# Patient Record
Sex: Female | Born: 1990 | Hispanic: No | Marital: Single | Smoking: Never smoker
Health system: Southern US, Community
[De-identification: ages and names within clinical notes are randomized; demographics above are authoritative.]

---

## 2020-07-25 ENCOUNTER — Ambulatory Visit: Payer: Self-pay | Admitting: Family Medicine

## 2020-11-30 ENCOUNTER — Encounter (HOSPITAL_BASED_OUTPATIENT_CLINIC_OR_DEPARTMENT_OTHER): Payer: Self-pay

## 2020-11-30 ENCOUNTER — Emergency Department (HOSPITAL_BASED_OUTPATIENT_CLINIC_OR_DEPARTMENT_OTHER)
Admission: EM | Admit: 2020-11-30 | Discharge: 2020-11-30 | Disposition: A | Discharge status: Home / Self Care | Payer: Self-pay | Attending: Emergency Medicine | Admitting: Emergency Medicine

## 2020-11-30 ENCOUNTER — Other Ambulatory Visit: Payer: Self-pay

## 2020-11-30 ENCOUNTER — Emergency Department (HOSPITAL_BASED_OUTPATIENT_CLINIC_OR_DEPARTMENT_OTHER): Payer: Self-pay

## 2020-11-30 DIAGNOSIS — S61011A Laceration without foreign body of right thumb without damage to nail, initial encounter: Secondary | ICD-10-CM | POA: Insufficient documentation

## 2020-11-30 DIAGNOSIS — S80812A Abrasion, left lower leg, initial encounter: Secondary | ICD-10-CM | POA: Insufficient documentation

## 2020-11-30 DIAGNOSIS — S51842A Puncture wound with foreign body of left forearm, initial encounter: Secondary | ICD-10-CM | POA: Insufficient documentation

## 2020-11-30 DIAGNOSIS — S61532A Puncture wound without foreign body of left wrist, initial encounter: Secondary | ICD-10-CM | POA: Insufficient documentation

## 2020-11-30 DIAGNOSIS — W540XXA Bitten by dog, initial encounter: Secondary | ICD-10-CM | POA: Insufficient documentation

## 2020-11-30 DIAGNOSIS — Z23 Encounter for immunization: Secondary | ICD-10-CM | POA: Insufficient documentation

## 2020-11-30 DIAGNOSIS — Z2914 Encounter for prophylactic rabies immune globin: Secondary | ICD-10-CM | POA: Insufficient documentation

## 2020-11-30 MED ORDER — RABIES VACCINE, PCEC IM SUSR
1.0000 mL | Freq: Once | INTRAMUSCULAR | Status: AC
  Administered 2020-11-30: 1 mL via INTRAMUSCULAR
  Filled 2020-11-30: qty 1

## 2020-11-30 MED ORDER — AMOXICILLIN-POT CLAVULANATE 875-125 MG PO TABS: 1.0000 | ORAL_TABLET | Freq: Two times a day (BID) | ORAL | 0 refills | Status: AC

## 2020-11-30 MED ORDER — IBUPROFEN 400 MG PO TABS
600.0000 mg | ORAL_TABLET | Freq: Once | ORAL | Status: AC
  Administered 2020-11-30: 600 mg via ORAL
  Filled 2020-11-30: qty 1

## 2020-11-30 MED ORDER — TETANUS-DIPHTH-ACELL PERTUSSIS 5-2.5-18.5 LF-MCG/0.5 IM SUSY
0.5000 mL | PREFILLED_SYRINGE | Freq: Once | INTRAMUSCULAR | Status: AC
  Administered 2020-11-30: 0.5 mL via INTRAMUSCULAR
  Filled 2020-11-30: qty 0.5

## 2020-11-30 MED ORDER — AMOXICILLIN-POT CLAVULANATE 875-125 MG PO TABS
1.0000 | ORAL_TABLET | Freq: Once | ORAL | Status: AC
  Administered 2020-11-30: 1 via ORAL
  Filled 2020-11-30: qty 1

## 2020-11-30 MED ORDER — RABIES IMMUNE GLOBULIN 150 UNIT/ML IM INJ
20.0000 [IU]/kg | INJECTION | Freq: Once | INTRAMUSCULAR | Status: AC
  Administered 2020-11-30: 1200 [IU] via INTRAMUSCULAR
  Filled 2020-11-30: qty 8

## 2020-11-30 NOTE — Discharge Instructions (Addendum)
Take the antibiotics as prescribed to help prevent infection.  Keep the wounds clean and dry.  You need to return on January 23 for rabies booster, you need to return on January 27 for rabies booster again, and you need to return on February 3 for your final rabies booster.  These will be done at [facility].  We are mandated to report dog bites from unvaccinated dog to animal control.  They will follow-up with you.

## 2020-11-30 NOTE — ED Triage Notes (Signed)
Pt states one of her dogs bit her L wrist and R thumb last night around 0230. Pt states the dog does not have their vaccines.
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
## 2021-10-08 ENCOUNTER — Encounter (HOSPITAL_COMMUNITY): Payer: Self-pay

## 2021-10-08 ENCOUNTER — Emergency Department (HOSPITAL_COMMUNITY)
Admission: EM | Admit: 2021-10-08 | Discharge: 2021-10-08 | Disposition: A | Discharge status: Home / Self Care | Payer: Self-pay | Attending: Emergency Medicine | Admitting: Emergency Medicine

## 2021-10-08 ENCOUNTER — Other Ambulatory Visit: Payer: Self-pay

## 2021-10-08 DIAGNOSIS — S51852A Open bite of left forearm, initial encounter: Secondary | ICD-10-CM | POA: Insufficient documentation

## 2021-10-08 DIAGNOSIS — W540XXA Bitten by dog, initial encounter: Secondary | ICD-10-CM | POA: Insufficient documentation

## 2021-10-08 DIAGNOSIS — Z5321 Procedure and treatment not carried out due to patient leaving prior to being seen by health care provider: Secondary | ICD-10-CM | POA: Insufficient documentation

## 2021-10-08 NOTE — ED Notes (Addendum)
Attempted to irrigate patient wounds. Patient refuses. States she has cleaned wounds already. Wounds visibly dressed with gauze and coband. PA aware.

## 2021-10-08 NOTE — ED Triage Notes (Signed)
Patient reports that she was bitten by her dog (red nose pitbull). Patient got in between 2 dogs while fighting. Patient has several bit marks to the left forearm.  Patient states the dog was not up to date on her vaccinations.

## 2021-10-08 NOTE — ED Provider Notes (Signed)
Emergency Medicine Provider Triage Evaluation Note  Sandra Hensley , a 30 y.o. female  was evaluated in triage.  Pt complains of dog bite that occurred 1 hour ago by about a 57-year-old red.  She is unsure if the dog is up-to-date on its shots.  She states she got a tetanus shot about a year ago when she was last bitten by the dog.  She states her dog was fighting another dog and she tried to stop it when she was bitten on her left arm.   Review of Systems  Positive: Dog bite to left arm Negative:   Physical Exam  There were no vitals taken for this visit. Gen:   Awake, no distress   Resp:  Normal effort  MSK:   Moves extremities without difficulty  Other:  Dog bite to left upper extremity.  Bleeding controlled with pressure  Medical Decision Making  Medically screening exam initiated at 4:23 PM.  Appropriate orders placed.  Sandra Hensley was informed that the remainder of the evaluation will be completed by another provider, this initial triage assessment does not replace that evaluation, and the importance of remaining in the ED until their evaluation is complete.     Sandra [place], PA-C 10/08/21 1625    Sandra Grizzle, MD 10/10/21 1024

## 2021-10-08 NOTE — ED Notes (Signed)
PT ambulatory out of department stating her ride is here. PA made aware.

## 2021-10-08 NOTE — ED Provider Notes (Signed)
Notified that patient left before I had an opportunity to see her.  Nurse reports that patient has self dressed her wounds after she had an MSE exam done and refused to let the nurse undress stating she had cleaned the wounds herself and dressed them.  Discussion was had with patient during MSE exam she would require irrigation of the wounds to prevent developing of infection and that she would require antibiotics.   Marita Kansas, PA-C 10/08/21 1817    Terald Sleeper, MD 10/08/21 [phone]

## 2022-01-23 IMAGING — DX DG WRIST COMPLETE 3+V*L*
4 series · 4 of 4 positions shown · non-contrast
Comparison: No prior.

CLINICAL DATA: Dog bite.  Left wrist pain.

EXAM:
LEFT WRIST - COMPLETE 3+ VIEW

[wrist pa]
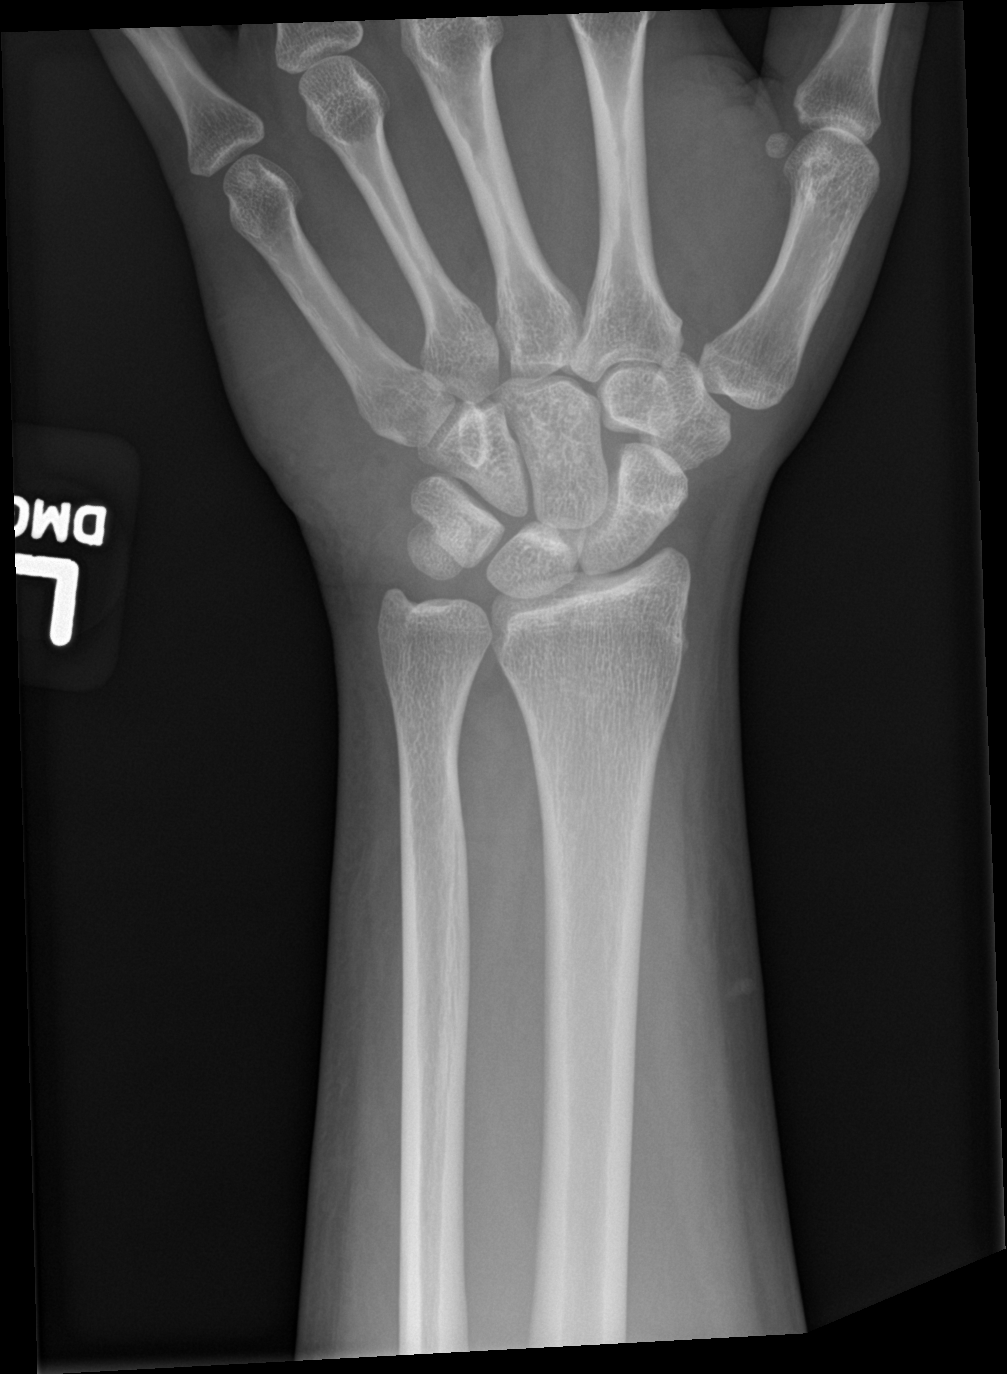

[wrist obl]
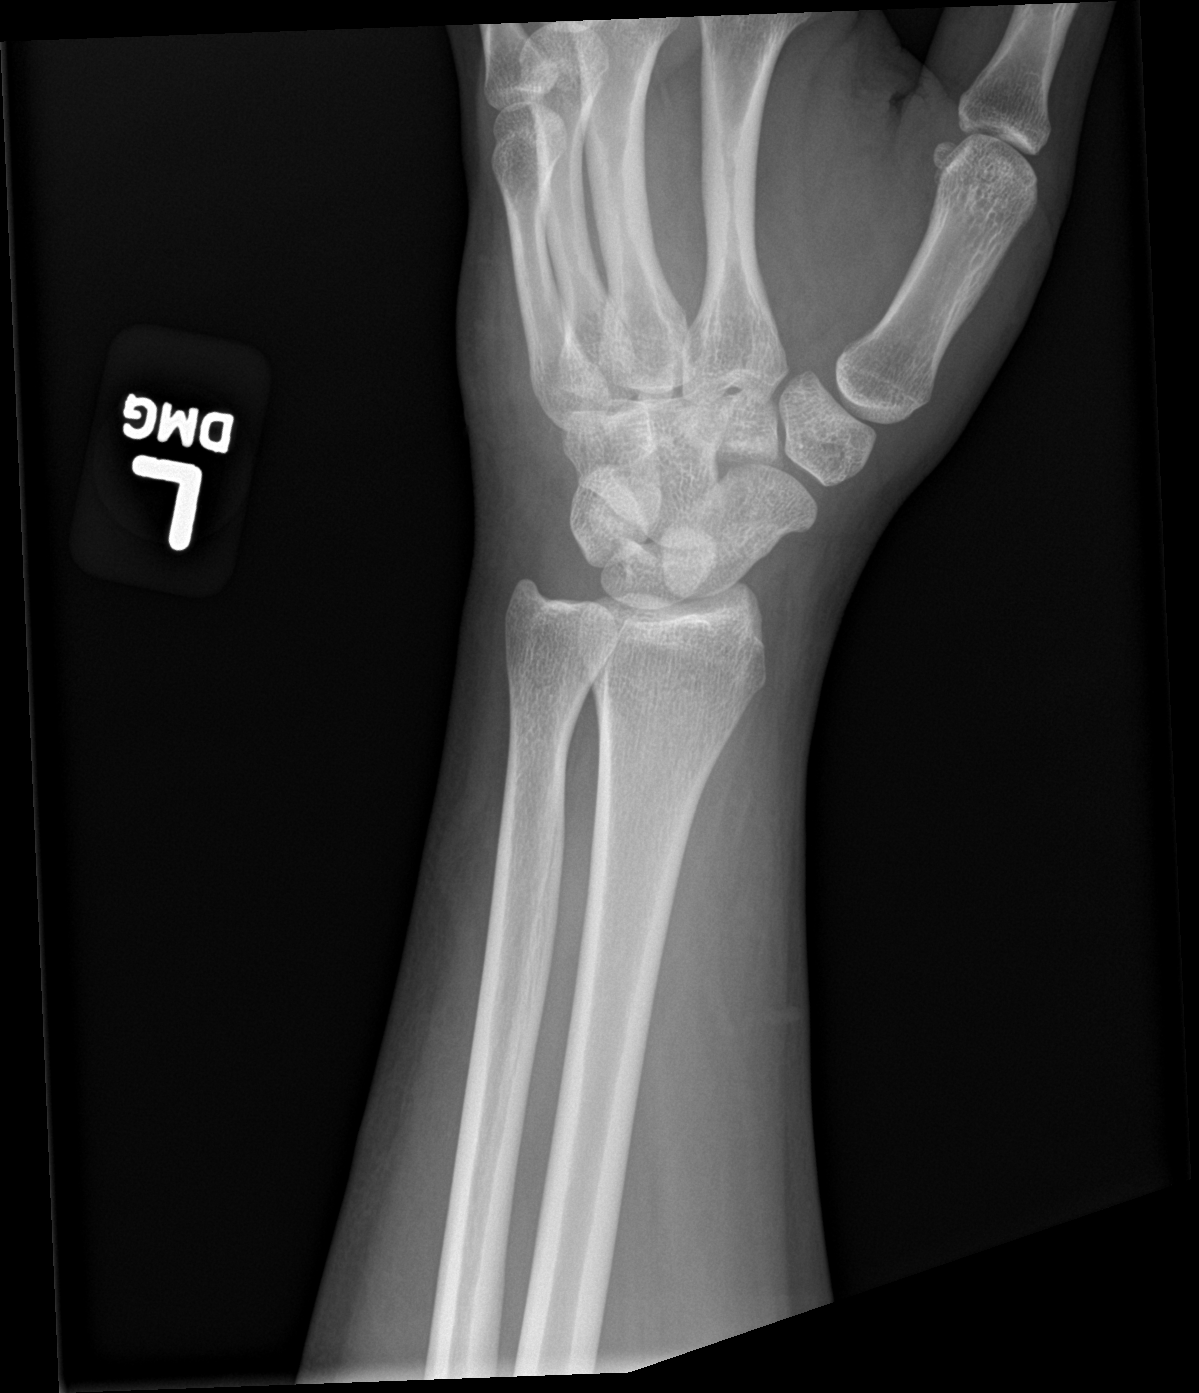

[wrist lat]
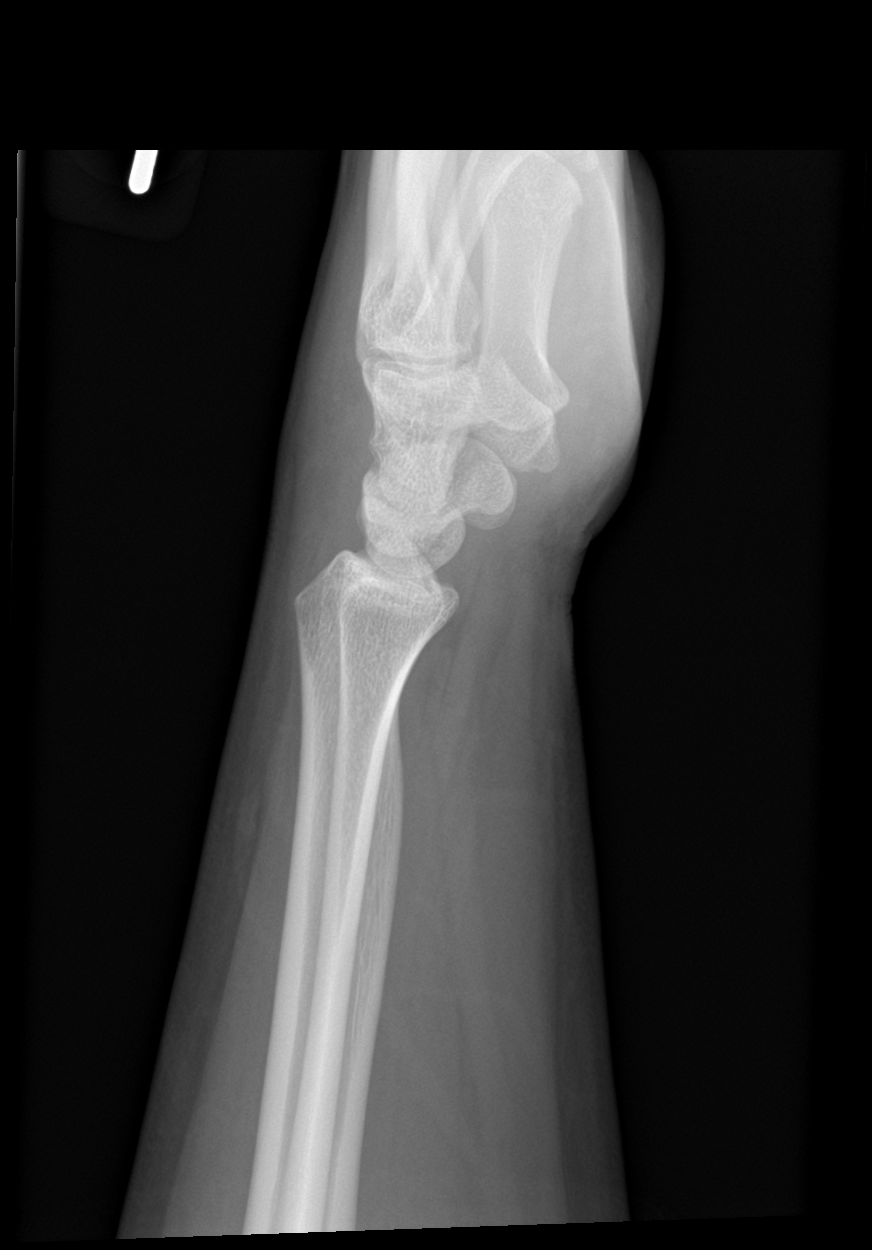

[wrist navicular]
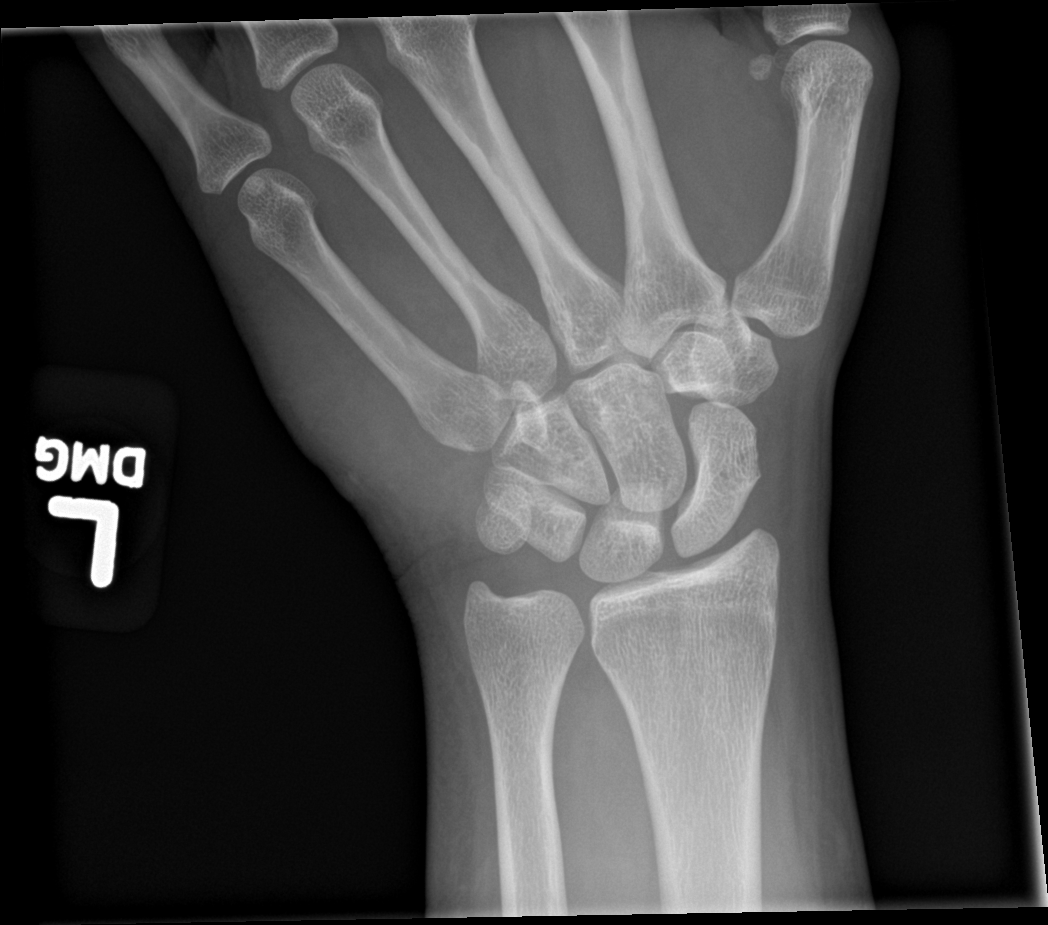

[4 of 4 positions shown; findings below may reference images not displayed]

FINDINGS: Small soft tissue density noted over the radial aspect of the distal
forearm, possibly a small skin lesion. Clinical correlation
suggested. No calcified or metallic foreign body noted. Soft tissue
swelling of noted about the ulnar aspect of the wrist. Small amount
of associated soft tissue air may be present. No evidence of
fracture or dislocation. No acute bony abnormality.
IMPRESSION: 1. Small soft tissue density noted over the radial aspect of the
distal forearm, possibly a small skin lesion. Clinical correlation
suggested. No calcified or metallic foreign body noted.

2. Soft tissue swelling noted about the ulnar aspect of the wrist.
Small amount of associated soft tissue air may be present. No
underlying bony abnormality identified. No evidence of fracture or
dislocation.

## 2023-01-13 ENCOUNTER — Ambulatory Visit (HOSPITAL_COMMUNITY)
Admission: EM | Admit: 2023-01-13 | Discharge: 2023-01-13 | Disposition: A | Discharge status: Home / Self Care | Payer: Self-pay | Attending: Psychiatry | Admitting: Psychiatry

## 2023-01-13 DIAGNOSIS — Z008 Encounter for other general examination: Secondary | ICD-10-CM | POA: Insufficient documentation

## 2023-01-13 DIAGNOSIS — Z789 Other specified health status: Secondary | ICD-10-CM
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
## 2023-01-13 NOTE — Discharge Instructions (Signed)
Based on what you have shared, a list of resources for outpatient therapy and psychiatry is provided below to get you started back on treatment.  It is imperative that you follow through with treatment within 5-7 days from the day of discharge to prevent any further risk to your safety or mental well-being.  You are not limited to the list provided.  In case of an urgent crisis, you may contact the Mobile Crisis Unit with [facility] at [phone].        Outpatient Services for Therapy and Medication Management for [facility] [place], [place], [address] [phone] phone  New Patient Assessment/Therapy Walk-ins Monday and Wednesday: 8am until slots are full. Every 1st and 2nd Friday: 1pm - 5pm  NO ASSESSMENT/THERAPY WALK-INS ON [place]  New Patient Psychiatry/Medication Management Walk-ins Monday-Friday: 8am-11am  For all walk-ins, we ask that you arrive by 7:30am because patient will be seen in the order of arrival.  Availability is limited; therefore, you may not be seen on the same day that you walk-in.  Our goal is to serve and meet the needs of our community to the best of our ability.   [facility] [address], [place], [place], [address] [phone] phone  [facility], [place], [place], [address] [phone] phone [phone] phone ([address], AmeriHealth, Anthem/Elevance, BCBS, [place], [place], ComPsych, Healthy [place], [place], [place], [place], [place], [employer], [place], Out of Network)  [employer] [facility], [address] [place], [place], [address] [phone] phone ([place], Anthem/Elevance, [employer] Options/[place]lon, [place], [place], [place], [place], [place], [employer], [place], [place], [place], [facility])  [facility] [address] [place], [place], [address] [phone] phone (Medicaid, ask about other insurance)  [facility] [address], [place], [place], [address] [phone] phone [phone] fax ([address] [place] , [place], [place], [place], UHC, [employer] Pride, Self-Pay)  [address] [place], [place], [address] [phone] phone ([address], Anthem/Elevance, [place], [place], [place], [place], [place], [employer], [place], [place], [place], [facility])  [place] - 6-8 MONTH WAIT FOR THERAPY; [facility]NAG[facility] [address], [place], [place], [address] [phone] phone ([address], [place], [place], [place], [place], [employer] Health Plans, [place], [place], [place], [place], [place], [place], Tricare, UHC, [employer], [place])  [facility] [address], [place], [place], [address] [phone] phone  [facility] [address] [place], [place], [address] [phone] phone  [facility] [address], [place], [place], [address] [phone] phone  [facility] [address] [place], [address] [phone] phone  [facility], [place] [address], [place], [address] [phone] phone  [facility] [place], [place], [address] [phone] phone [phone] fax  [facility] [address] [place], [place], [address] [phone] phone [phone] fax  [facility] [phone] [place], [place], [address] [phone] phone  [facility] [address] [place], [place], [address]  [phone] phone  [facility]  (Adults Only) [address] [place], [place],  [address]  [phone] phone [phone] fax

## 2023-03-14 ENCOUNTER — Ambulatory Visit (HOSPITAL_COMMUNITY): Payer: Self-pay | Admitting: Physician Assistant

## 2023-03-14 ENCOUNTER — Encounter (HOSPITAL_COMMUNITY): Payer: Self-pay
